# Patient Record
Sex: Male | Born: 2000 | Race: White | Hispanic: No | Marital: Single | Smoking: Never smoker
Health system: Southern US, Community
[De-identification: ages and names within clinical notes are randomized; demographics above are authoritative.]

## PROBLEM LIST (undated history)

## (undated) ENCOUNTER — Emergency Department (HOSPITAL_COMMUNITY): Payer: BC Managed Care – PPO | Source: Home / Self Care

## (undated) DIAGNOSIS — F909 Attention-deficit hyperactivity disorder, unspecified type: Secondary | ICD-10-CM

## (undated) DIAGNOSIS — H547 Unspecified visual loss: Secondary | ICD-10-CM

## (undated) DIAGNOSIS — Z8489 Family history of other specified conditions: Secondary | ICD-10-CM

## (undated) HISTORY — PX: CIRCUMCISION: SUR203

---
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
## 2023-06-06 ENCOUNTER — Ambulatory Visit
Admission: RE | Admit: 2023-06-06 | Discharge: 2023-06-06 | Disposition: A | Discharge status: Home / Self Care | Source: Ambulatory Visit | Attending: Physician Assistant | Admitting: Physician Assistant

## 2023-06-06 ENCOUNTER — Other Ambulatory Visit: Payer: Self-pay | Admitting: Physician Assistant

## 2023-06-06 DIAGNOSIS — M25611 Stiffness of right shoulder, not elsewhere classified: Secondary | ICD-10-CM

## 2023-06-27 ENCOUNTER — Other Ambulatory Visit: Payer: Self-pay

## 2023-06-27 ENCOUNTER — Encounter (HOSPITAL_COMMUNITY): Payer: Self-pay | Admitting: General Surgery

## 2023-06-27 NOTE — Progress Notes (Signed)
 SDW call  Patient's mother Loyce Ruffini was given pre-op instructions over the phone as patient works very late and is unavailable. She verbalized understanding of instructions provided.     PCP - Dr. Tobias Forth Cardiologist -  Pulmonary:    PPM/ICD - denies Device Orders - na Rep Notified - na   Chest x-ray - na EKG -  na Stress Test - ECHO -  Cardiac Cath -   Sleep Study/sleep apnea/CPAP: denies  Non-diabetic   Blood Thinner Instructions: denies Aspirin Instructions:denies   ERAS Protcol - Clears until 0430   Anesthesia review: No   Patient denies shortness of breath, fever, cough and chest pain over the phone call  Your procedure is scheduled on Friday Jun 30, 2023  Report to [facility] Main Entrance "A" at 0530   A.M., then check in with the Admitting office.  Call this number if you have problems the morning of surgery:  [phone]   If you have any questions prior to your surgery date call [phone]: Open Monday-Friday 8am-4pm If you experience any cold or flu symptoms such as cough, fever, chills, shortness of breath, etc. between now and your scheduled surgery, please notify us  at the above number     Remember:  Do not eat after midnight the night before your surgery  You may drink clear liquids until 0430  the morning of your surgery.   Clear liquids allowed are: Water, Non-Citrus Juices (without pulp), Carbonated Beverages, Clear Tea, Black Coffee ONLY (NO MILK, CREAM OR POWDERED CREAMER of any kind), and Gatorade   Take these medicines the morning of surgery with A SIP OF WATER:  Augmentin  As of today, STOP taking any Aspirin (unless otherwise instructed by your surgeon) Aleve, Naproxen, Ibuprofen , Motrin , Advil , Goody's, BC's, all herbal medications, fish oil, and all vitamins.

## 2023-06-29 ENCOUNTER — Ambulatory Visit: Payer: Self-pay | Admitting: General Surgery

## 2023-06-29 NOTE — H&P (Signed)
 HPI  Mason Baker is a 23 y.o. male was seen in clinic on 4/24 for pilonidal cyst   Patient first experienced pain in gluteal area 2-3 months ago. He did not address it at first but the area became discolored and very painful. He went to UC where it was lanced. He had an additional episodes where no drainable abscess present but received antibiotics with improvement of symptoms. Recently saw PCP and given persistent cyst even though no obvious signs of infection, referred to our general surgery clinic.   Patient denies fevers/chills. Denies drainage or erythema at this time.  10 point review of systems is negative except as listed above in HPI.  Objective  Past Medical History: Past Medical History:  Diagnosis Date   ADHD (attention deficit hyperactivity disorder)    sees Dr Adell Age   Family history of adverse reaction to anesthesia    mom gets nauseated   Vision problems     Past Surgical History: Past Surgical History:  Procedure Laterality Date   CIRCUMCISION     TENDON REPAIR Right 07/06/2013   Procedure: TENDON REPAIR,REPAIR AS INDICATED;  Surgeon: Shellie Dials, MD;  Location: [facility] OR;  Service: Orthopedics;  Laterality: Right;   WOUND EXPLORATION Right 07/06/2013   Procedure: RIGHT SMALL FINGER WITH WOUND EXPLORATION  ;  Surgeon: Shellie Dials, MD;  Location: [facility] OR;  Service: Orthopedics;  Laterality: Right;    Family History:  Family History  Problem Relation Age of Onset   Diabetes Father    Heart disease Father    Hyperlipidemia Father    Asthma Sister    Migraines Sister    Hypertension Maternal Uncle    Heart disease Maternal Uncle    Kidney disease Maternal Uncle    IgA nephropathy Maternal Uncle    ADD / ADHD Maternal Uncle    Asthma Maternal Grandmother    Hypotension Maternal Grandmother    Migraines Maternal Grandmother    Depression Maternal Grandmother    Cancer Maternal Grandfather    Diabetes Maternal Grandfather    Heart disease Maternal  Grandfather    Hyperlipidemia Maternal Grandfather    Hyperlipidemia Paternal Grandfather    Colon cancer Paternal Grandfather    Heart Problems Paternal Grandfather    Diabetes Paternal Grandfather    Cancer Other    Stroke Other    Migraines Mother    Depression Mother    ADD / ADHD Mother     Social History:  reports that he has never smoked. He has never used smokeless tobacco. He reports current alcohol use. He reports that he does not use drugs.  Allergies: No Known Allergies  Medications: I have reviewed the patient's current medications.  Labs: Pertinent lab work personally reviewed.  Imaging: Pertinent imaging personally reviewed  Physical Exam There were no vitals taken for this visit. General: No acute distress, well appearing HEENT: PERRL, hearing grossly normal, mucous membranes moist CV: Regular rate and rhythm Pulm: Normal work of breathing on room air GU: 2x1cm cyst at left superior gluteal fold. No erythema/drainage/fluctuance. Nontender. Several pits identified in midline gluteal cleft consistent with pilonidal sinuses. Extremities: Warm and well perfused Neuro: A&O x4, no focal neurologic deficits Psych: Appropriate mood and effect    Assessment   Mason Baker is an 23 y.o. male with pilonidal cyst  Plan  - Proceed to OR for excision of pilonidal cyst - We discussed risks of surgery including but not limited to: bleeding, infection, recurrence. Possibility  of primary closure versus open wound with healing by secondary intention if any signs of infection intra-op. We also discussed this area is higher risk for wound dehiscence if primarily closed.    Freddrick Jaffe, MD [facility]

## 2023-06-29 NOTE — H&P (View-Only) (Signed)
 HPI  Mason Baker is a 23 y.o. male was seen in clinic on 4/24 for pilonidal cyst   Patient first experienced pain in gluteal area 2-3 months ago. He did not address it at first but the area became discolored and very painful. He went to UC where it was lanced. He had an additional episodes where no drainable abscess present but received antibiotics with improvement of symptoms. Recently saw PCP and given persistent cyst even though no obvious signs of infection, referred to our general surgery clinic.   Patient denies fevers/chills. Denies drainage or erythema at this time.  10 point review of systems is negative except as listed above in HPI.  Objective  Past Medical History: Past Medical History:  Diagnosis Date   ADHD (attention deficit hyperactivity disorder)    sees Dr Adell Age   Family history of adverse reaction to anesthesia    mom gets nauseated   Vision problems     Past Surgical History: Past Surgical History:  Procedure Laterality Date   CIRCUMCISION     TENDON REPAIR Right 07/06/2013   Procedure: TENDON REPAIR,REPAIR AS INDICATED;  Surgeon: Shellie Dials, MD;  Location: [facility] OR;  Service: Orthopedics;  Laterality: Right;   WOUND EXPLORATION Right 07/06/2013   Procedure: RIGHT SMALL FINGER WITH WOUND EXPLORATION  ;  Surgeon: Shellie Dials, MD;  Location: [facility] OR;  Service: Orthopedics;  Laterality: Right;    Family History:  Family History  Problem Relation Age of Onset   Diabetes Father    Heart disease Father    Hyperlipidemia Father    Asthma Sister    Migraines Sister    Hypertension Maternal Uncle    Heart disease Maternal Uncle    Kidney disease Maternal Uncle    IgA nephropathy Maternal Uncle    ADD / ADHD Maternal Uncle    Asthma Maternal Grandmother    Hypotension Maternal Grandmother    Migraines Maternal Grandmother    Depression Maternal Grandmother    Cancer Maternal Grandfather    Diabetes Maternal Grandfather    Heart disease Maternal  Grandfather    Hyperlipidemia Maternal Grandfather    Hyperlipidemia Paternal Grandfather    Colon cancer Paternal Grandfather    Heart Problems Paternal Grandfather    Diabetes Paternal Grandfather    Cancer Other    Stroke Other    Migraines Mother    Depression Mother    ADD / ADHD Mother     Social History:  reports that he has never smoked. He has never used smokeless tobacco. He reports current alcohol use. He reports that he does not use drugs.  Allergies: No Known Allergies  Medications: I have reviewed the patient's current medications.  Labs: Pertinent lab work personally reviewed.  Imaging: Pertinent imaging personally reviewed  Physical Exam There were no vitals taken for this visit. General: No acute distress, well appearing HEENT: PERRL, hearing grossly normal, mucous membranes moist CV: Regular rate and rhythm Pulm: Normal work of breathing on room air GU: 2x1cm cyst at left superior gluteal fold. No erythema/drainage/fluctuance. Nontender. Several pits identified in midline gluteal cleft consistent with pilonidal sinuses. Extremities: Warm and well perfused Neuro: A&O x4, no focal neurologic deficits Psych: Appropriate mood and effect    Assessment   Mason Baker is an 23 y.o. male with pilonidal cyst  Plan  - Proceed to OR for excision of pilonidal cyst - We discussed risks of surgery including but not limited to: bleeding, infection, recurrence. Possibility  of primary closure versus open wound with healing by secondary intention if any signs of infection intra-op. We also discussed this area is higher risk for wound dehiscence if primarily closed.    Freddrick Jaffe, MD [facility]

## 2023-06-30 ENCOUNTER — Encounter (HOSPITAL_COMMUNITY): Payer: Self-pay | Admitting: General Surgery

## 2023-06-30 ENCOUNTER — Encounter (HOSPITAL_COMMUNITY)
Admission: RE | Disposition: A | Discharge status: Home / Self Care | Payer: Self-pay | Source: Home / Self Care | Attending: General Surgery

## 2023-06-30 ENCOUNTER — Ambulatory Visit (HOSPITAL_COMMUNITY)
Admission: RE | Admit: 2023-06-30 | Discharge: 2023-06-30 | Disposition: A | Discharge status: Home / Self Care | Attending: General Surgery | Admitting: General Surgery

## 2023-06-30 ENCOUNTER — Other Ambulatory Visit: Payer: Self-pay

## 2023-06-30 ENCOUNTER — Ambulatory Visit (HOSPITAL_COMMUNITY): Payer: Self-pay

## 2023-06-30 ENCOUNTER — Ambulatory Visit (HOSPITAL_BASED_OUTPATIENT_CLINIC_OR_DEPARTMENT_OTHER): Payer: Self-pay

## 2023-06-30 DIAGNOSIS — F419 Anxiety disorder, unspecified: Secondary | ICD-10-CM | POA: Insufficient documentation

## 2023-06-30 DIAGNOSIS — L0591 Pilonidal cyst without abscess: Secondary | ICD-10-CM | POA: Diagnosis present

## 2023-06-30 DIAGNOSIS — R519 Headache, unspecified: Secondary | ICD-10-CM | POA: Diagnosis not present

## 2023-06-30 DIAGNOSIS — E669 Obesity, unspecified: Secondary | ICD-10-CM | POA: Insufficient documentation

## 2023-06-30 DIAGNOSIS — Z833 Family history of diabetes mellitus: Secondary | ICD-10-CM | POA: Diagnosis not present

## 2023-06-30 DIAGNOSIS — F909 Attention-deficit hyperactivity disorder, unspecified type: Secondary | ICD-10-CM | POA: Diagnosis not present

## 2023-06-30 DIAGNOSIS — Z6834 Body mass index (BMI) 34.0-34.9, adult: Secondary | ICD-10-CM | POA: Insufficient documentation

## 2023-06-30 HISTORY — DX: Family history of other specified conditions: Z84.89

## 2023-06-30 HISTORY — PX: PILONIDAL CYST DRAINAGE: SHX743

## 2023-06-30 LAB — CBC
HCT: 46.2 % (ref 39.0–52.0)
Hemoglobin: 16.1 g/dL (ref 13.0–17.0)
MCH: 30.4 pg (ref 26.0–34.0)
MCHC: 34.8 g/dL (ref 30.0–36.0)
MCV: 87.3 fL (ref 80.0–100.0)
Platelets: 314 10*3/uL (ref 150–400)
RBC: 5.29 MIL/uL (ref 4.22–5.81)
RDW: 11.5 % (ref 11.5–15.5)
WBC: 5.9 10*3/uL (ref 4.0–10.5)
nRBC: 0 % (ref 0.0–0.2)

## 2023-06-30 SURGERY — EXCISION, PILONIDAL CYST
Anesthesia: General | Site: Buttocks

## 2023-06-30 MED ORDER — ONDANSETRON HCL 4 MG/2ML IJ SOLN
INTRAMUSCULAR | Status: AC
  Filled 2023-06-30: qty 2

## 2023-06-30 MED ORDER — CEFAZOLIN SODIUM-DEXTROSE 2-4 GM/100ML-% IV SOLN
INTRAVENOUS | Status: AC
Start: 2023-06-30 — End: 2023-06-30
  Filled 2023-06-30: qty 100

## 2023-06-30 MED ORDER — GABAPENTIN 300 MG PO CAPS
ORAL_CAPSULE | ORAL | Status: AC
Start: 2023-06-30 — End: 2023-06-30
  Administered 2023-06-30: 300 mg via ORAL
  Filled 2023-06-30: qty 1

## 2023-06-30 MED ORDER — PROPOFOL 1000 MG/100ML IV EMUL
INTRAVENOUS | Status: AC
  Filled 2023-06-30: qty 200

## 2023-06-30 MED ORDER — ACETAMINOPHEN 500 MG PO TABS: 1000.0000 mg | ORAL_TABLET | ORAL | Status: AC

## 2023-06-30 MED ORDER — FENTANYL CITRATE (PF) 250 MCG/5ML IJ SOLN
INTRAMUSCULAR | Status: AC
  Filled 2023-06-30: qty 5

## 2023-06-30 MED ORDER — FENTANYL CITRATE (PF) 250 MCG/5ML IJ SOLN
INTRAMUSCULAR | Status: DC | PRN
  Administered 2023-06-30: 50 ug via INTRAVENOUS

## 2023-06-30 MED ORDER — ONDANSETRON HCL 4 MG/2ML IJ SOLN: 4.0000 mg | Freq: Once | INTRAMUSCULAR | Status: DC | PRN

## 2023-06-30 MED ORDER — PROPOFOL 10 MG/ML IV BOLUS
INTRAVENOUS | Status: AC
  Filled 2023-06-30: qty 20

## 2023-06-30 MED ORDER — CHLORHEXIDINE GLUCONATE 0.12 % MT SOLN: 15.0000 mL | Freq: Once | OROMUCOSAL | Status: AC

## 2023-06-30 MED ORDER — PHENYLEPHRINE HCL-NACL 20-0.9 MG/250ML-% IV SOLN
INTRAVENOUS | Status: AC
  Filled 2023-06-30: qty 250

## 2023-06-30 MED ORDER — CHLORHEXIDINE GLUCONATE 0.12 % MT SOLN
OROMUCOSAL | Status: AC
Start: 2023-06-30 — End: 2023-06-30
  Administered 2023-06-30: 15 mL via OROMUCOSAL
  Filled 2023-06-30: qty 15

## 2023-06-30 MED ORDER — BUPIVACAINE-EPINEPHRINE (PF) 0.25% -1:200000 IJ SOLN
INTRAMUSCULAR | Status: AC
  Filled 2023-06-30: qty 30

## 2023-06-30 MED ORDER — BUPIVACAINE-EPINEPHRINE 0.25% -1:200000 IJ SOLN
INTRAMUSCULAR | Status: DC | PRN
  Administered 2023-06-30: 30 mL

## 2023-06-30 MED ORDER — ROCURONIUM BROMIDE 10 MG/ML (PF) SYRINGE
PREFILLED_SYRINGE | INTRAVENOUS | Status: DC | PRN
Start: 2023-06-30 — End: 2023-06-30
  Administered 2023-06-30: 50 mg via INTRAVENOUS

## 2023-06-30 MED ORDER — CELECOXIB 200 MG PO CAPS: 200.0000 mg | ORAL_CAPSULE | ORAL | Status: AC

## 2023-06-30 MED ORDER — DEXAMETHASONE SODIUM PHOSPHATE 10 MG/ML IJ SOLN
INTRAMUSCULAR | Status: DC | PRN
  Administered 2023-06-30: 5 mg via INTRAVENOUS

## 2023-06-30 MED ORDER — MIDAZOLAM HCL 2 MG/2ML IJ SOLN
INTRAMUSCULAR | Status: AC
  Filled 2023-06-30: qty 2

## 2023-06-30 MED ORDER — ONDANSETRON HCL 4 MG/2ML IJ SOLN
INTRAMUSCULAR | Status: DC | PRN
  Administered 2023-06-30: 4 mg via INTRAVENOUS

## 2023-06-30 MED ORDER — PROPOFOL 10 MG/ML IV BOLUS
INTRAVENOUS | Status: DC | PRN
  Administered 2023-06-30: 200 mg via INTRAVENOUS

## 2023-06-30 MED ORDER — PHENYLEPHRINE 80 MCG/ML (10ML) SYRINGE FOR IV PUSH (FOR BLOOD PRESSURE SUPPORT)
PREFILLED_SYRINGE | INTRAVENOUS | Status: DC | PRN
  Administered 2023-06-30 (×3): 80 ug via INTRAVENOUS

## 2023-06-30 MED ORDER — ROCURONIUM BROMIDE 10 MG/ML (PF) SYRINGE
PREFILLED_SYRINGE | INTRAVENOUS | Status: AC
  Filled 2023-06-30: qty 10

## 2023-06-30 MED ORDER — ACETAMINOPHEN 500 MG PO TABS
ORAL_TABLET | ORAL | Status: AC
  Administered 2023-06-30: 1000 mg via ORAL
  Filled 2023-06-30: qty 2

## 2023-06-30 MED ORDER — MIDAZOLAM HCL 2 MG/2ML IJ SOLN
INTRAMUSCULAR | Status: DC | PRN
  Administered 2023-06-30: 2 mg via INTRAVENOUS

## 2023-06-30 MED ORDER — LIDOCAINE 2% (20 MG/ML) 5 ML SYRINGE
INTRAMUSCULAR | Status: AC
  Filled 2023-06-30: qty 5

## 2023-06-30 MED ORDER — LACTATED RINGERS IV SOLN: INTRAVENOUS | Status: DC

## 2023-06-30 MED ORDER — GABAPENTIN 300 MG PO CAPS
300.0000 mg | ORAL_CAPSULE | ORAL | Status: AC
Start: 2023-06-30 — End: 2023-06-30

## 2023-06-30 MED ORDER — OXYCODONE HCL 5 MG PO TABS: 5.0000 mg | ORAL_TABLET | ORAL | 0 refills | Status: AC | PRN

## 2023-06-30 MED ORDER — LIDOCAINE 2% (20 MG/ML) 5 ML SYRINGE
INTRAMUSCULAR | Status: DC | PRN
  Administered 2023-06-30: 100 mg via INTRAVENOUS

## 2023-06-30 MED ORDER — ORAL CARE MOUTH RINSE: 15.0000 mL | Freq: Once | OROMUCOSAL | Status: AC

## 2023-06-30 MED ORDER — 0.9 % SODIUM CHLORIDE (POUR BTL) OPTIME
TOPICAL | Status: DC | PRN
  Administered 2023-06-30: 1000 mL

## 2023-06-30 MED ORDER — ENOXAPARIN SODIUM 40 MG/0.4ML IJ SOSY
PREFILLED_SYRINGE | INTRAMUSCULAR | Status: AC
Start: 2023-06-30 — End: 2023-06-30
  Administered 2023-06-30: 40 mg via SUBCUTANEOUS
  Filled 2023-06-30: qty 0.4

## 2023-06-30 MED ORDER — SUGAMMADEX SODIUM 200 MG/2ML IV SOLN
INTRAVENOUS | Status: DC | PRN
  Administered 2023-06-30: 200 mg via INTRAVENOUS

## 2023-06-30 MED ORDER — AMISULPRIDE (ANTIEMETIC) 5 MG/2ML IV SOLN: 10.0000 mg | Freq: Once | INTRAVENOUS | Status: DC | PRN

## 2023-06-30 MED ORDER — DEXAMETHASONE SODIUM PHOSPHATE 10 MG/ML IJ SOLN
INTRAMUSCULAR | Status: AC
  Filled 2023-06-30: qty 1

## 2023-06-30 MED ORDER — FENTANYL CITRATE (PF) 100 MCG/2ML IJ SOLN: 25.0000 ug | INTRAMUSCULAR | Status: DC | PRN

## 2023-06-30 MED ORDER — CEFAZOLIN SODIUM-DEXTROSE 2-4 GM/100ML-% IV SOLN
2.0000 g | INTRAVENOUS | Status: AC
  Administered 2023-06-30: 2 g via INTRAVENOUS

## 2023-06-30 MED ORDER — ENOXAPARIN SODIUM 40 MG/0.4ML IJ SOSY: 40.0000 mg | PREFILLED_SYRINGE | Freq: Once | INTRAMUSCULAR | Status: AC

## 2023-06-30 MED ORDER — KETAMINE HCL 50 MG/5ML IJ SOSY
PREFILLED_SYRINGE | INTRAMUSCULAR | Status: AC
  Filled 2023-06-30: qty 5

## 2023-06-30 MED ORDER — CELECOXIB 200 MG PO CAPS
ORAL_CAPSULE | ORAL | Status: AC
  Administered 2023-06-30: 200 mg via ORAL
  Filled 2023-06-30: qty 1

## 2023-06-30 SURGICAL SUPPLY — 26 items
BLADE CLIPPER SURG (BLADE) IMPLANT
CANISTER SUCTION 3000ML PPV (SUCTIONS) IMPLANT
COVER SURGICAL LIGHT HANDLE (MISCELLANEOUS) ×2 IMPLANT
DRAPE LAPAROTOMY T 102X78X121 (DRAPES) ×2 IMPLANT
ELECTRODE REM PT RTRN 9FT ADLT (ELECTROSURGICAL) ×2 IMPLANT
GAUZE PACKING IODOFORM 1/4X15 (PACKING) IMPLANT
GAUZE PAD ABD 8X10 STRL (GAUZE/BANDAGES/DRESSINGS) IMPLANT
GAUZE SPONGE 4X4 12PLY STRL (GAUZE/BANDAGES/DRESSINGS) ×2 IMPLANT
GLOVE SURG SIGNA 7.5 PF LTX (GLOVE) ×4 IMPLANT
GOWN STRL REUS W/ TWL LRG LVL3 (GOWN DISPOSABLE) ×2 IMPLANT
GOWN STRL REUS W/ TWL XL LVL3 (GOWN DISPOSABLE) ×2 IMPLANT
KIT BASIN OR (CUSTOM PROCEDURE TRAY) ×2 IMPLANT
KIT TURNOVER KIT B (KITS) ×2 IMPLANT
NDL HYPO 25GX1X1/2 BEV (NEEDLE) IMPLANT
NEEDLE HYPO 25GX1X1/2 BEV (NEEDLE) IMPLANT
NS IRRIG 1000ML POUR BTL (IV SOLUTION) ×2 IMPLANT
PACK GENERAL/GYN (CUSTOM PROCEDURE TRAY) ×2 IMPLANT
PAD ARMBOARD POSITIONER FOAM (MISCELLANEOUS) ×4 IMPLANT
PENCIL SMOKE EVACUATOR (MISCELLANEOUS) ×2 IMPLANT
PUNCH BIOPSY DISP 4 (MISCELLANEOUS) IMPLANT
SPECIMEN JAR SMALL (MISCELLANEOUS) IMPLANT
SUT ETHILON 2 0 FS 18 (SUTURE) IMPLANT
SUT VIC AB 3-0 SH 18 (SUTURE) IMPLANT
TOWEL GREEN STERILE (TOWEL DISPOSABLE) ×2 IMPLANT
TOWEL GREEN STERILE FF (TOWEL DISPOSABLE) ×2 IMPLANT
UNDERPAD 30X36 HEAVY ABSORB (UNDERPADS AND DIAPERS) ×2 IMPLANT

## 2023-06-30 NOTE — Anesthesia Preprocedure Evaluation (Addendum)
 Anesthesia Evaluation  Patient identified by MRN, date of birth, ID band Patient awake    Reviewed: Allergy & Precautions, NPO status , Patient's Chart, lab work & pertinent test results  History of Anesthesia Complications (+) Family history of anesthesia reaction  Airway Mallampati: II  TM Distance: >3 FB Neck ROM: Full    Dental  (+) Teeth Intact, Dental Advisory Given, Chipped,    Pulmonary neg pulmonary ROS   Pulmonary exam normal breath sounds clear to auscultation       Cardiovascular Exercise Tolerance: Good negative cardio ROS Normal cardiovascular exam Rhythm:Regular Rate:Normal     Neuro/Psych  Headaches PSYCHIATRIC DISORDERS Anxiety        GI/Hepatic negative GI ROS, Neg liver ROS,,,  Endo/Other  Obesity   Renal/GU negative Renal ROS     Musculoskeletal negative musculoskeletal ROS (+)    Abdominal   Peds  (+) ADHD Hematology negative hematology ROS (+)   Anesthesia Other Findings Day of surgery medications reviewed with the patient.  Pilonidal cyst   Reproductive/Obstetrics                             Anesthesia Physical Anesthesia Plan  ASA: 2  Anesthesia Plan: General   Post-op Pain Management: Tylenol  PO (pre-op)*, Celebrex PO (pre-op)* and Gabapentin PO (pre-op)*   Induction: Intravenous  PONV Risk Score and Plan: 2 and Midazolam , Dexamethasone and Ondansetron   Airway Management Planned: Oral ETT  Additional Equipment:   Intra-op Plan:   Post-operative Plan: Extubation in OR  Informed Consent: I have reviewed the patients History and Physical, chart, labs and discussed the procedure including the risks, benefits and alternatives for the proposed anesthesia with the patient or authorized representative who has indicated his/her understanding and acceptance.     Dental advisory given  Plan Discussed with: CRNA  Anesthesia Plan Comments:         Anesthesia Quick Evaluation

## 2023-06-30 NOTE — Transfer of Care (Signed)
 Immediate Anesthesia Transfer of Care Note  Patient: Mason Baker  Procedure(s) Performed: EXCISION, PILONIDAL CYST (Buttocks)  Patient Location: PACU  Anesthesia Type:General  Level of Consciousness: awake, alert , and patient cooperative  Airway & Oxygen Therapy: Patient Spontanous Breathing and Patient connected to face mask oxygen  Post-op Assessment: Report given to RN, Post -op Vital signs reviewed and stable, and Patient moving all extremities X 4  Post vital signs: Reviewed and stable  Last Vitals:  Vitals Value Taken Time  BP 123/73 06/30/23 0848  Temp    Pulse 100 06/30/23 0850  Resp 18 06/30/23 0850  SpO2 100 % 06/30/23 0850  Vitals shown include unfiled device data.  Last Pain:  Vitals:   06/30/23 0600  TempSrc:   PainSc: 0-No pain         Complications: There were no known notable events for this encounter.

## 2023-06-30 NOTE — Op Note (Signed)
 EXCISION, PILONIDAL CYST  Operative Note (CSN: [id number])  Service  Date of Surgery: 06/30/2023 Admit Date: 06/30/2023 Performing Service: General Surgeons and Role:    Ramiro Burly, Lisa Rideau, MD - Primary  Op Note Pre-op Diagnosis: PILONIDAL CYST Post-op Diagnosis: Post-Op Diagnosis Codes:    * Pilonidal cyst [L05.91]  Procedure(s): EXCISION, PILONIDAL CYST  Findings: 3x2cm chronically inflamed pilonidal cyst. Inferior pilonidal sinus tract. Both sent for pathology  Anesthesia: General Estimated Blood Loss: 5cc  Complications: None Specimens: Pilonidal sinus, Pilonidal cyst.  Brief history / Indications for Surgery: 23 yo M with history of pilonidal cyst with gluteal abscess now with persistent cyst and granulation tissue.  Procedure Details  Prior to the procedure, the risks, benefits, complications, treatment options, and expected outcomes were discussed with the patient and/or family, including but not limited to, the risks of bleeding, infection, post-op seroma, post-op hematoma, wound dehiscence, and recurrence.[name]  Despite the risks, the patient has given informed consent for operative intervention.  The patient was taken to the Operating Room, identified as Mason Baker and the procedure verified as excision of pilonidal cyst.  Identification pause was held and the above information confirmed.  The patient was placed in the supine position and General LMA anesthesia was induced. He was then positioned prone. The gluteal region was prepped with betadine and draped in the typical sterile fashion.  A formal preincision time out was performed.  Using a punch biopsy, two intergluteal cleft pits were excised and sent for pathology. This was probed with a lacrimal duct probe and no obvious connection to cyst in gluteal cleft.  An elliptical incision was then made overlying the palpable pilonidal cyst in the left gluteal cleft. Electrocautery was use for dissection of pilonidal cyst back  to healthy subcutaneous tissue. This was removed and sent for pathology. A lacrimal duct probe was then use again and a small communication was noted to inferior pits. The tract was curetted back to healthy bleeding tissue. The currette used again to probe at opening of excised pits.   Both wound beds irrigated and hemostasis confirmed. The wound opening inferiorly where pits excised was packed with 1/4 inch iodoform. Wound bed of pilonidal cyst approximated with 3-0 vicryls and skin closed with interrupted vertical mattress sutures using 2-0 nylon.  Instrument, sponge, and needle counts were correct at the conclusion of the case.   Freddrick Jaffe, MD [facility] Surgery Date: 06/30/2023  Time: 7:38 AM

## 2023-06-30 NOTE — Interval H&P Note (Signed)
 History and Physical Interval Note:  06/30/2023 7:11 AM  Mason Baker  has presented today for surgery, with the diagnosis of PILONIDAL CYST.  The various methods of treatment have been discussed with the patient and family. After consideration of risks, benefits and other options for treatment, the patient has consented to  Procedure(s) with comments: EXCISION, PILONIDAL CYST (N/A) - LATERALITY: NA PRONE EXCISION OF PILONAL CYST AND SINUS as a surgical intervention.  The patient's history has been reviewed, patient examined, no change in status, stable for surgery.  I have reviewed the patient's chart and labs.  Questions were answered to the patient's satisfaction.     Edmon Gosling

## 2023-06-30 NOTE — Anesthesia Postprocedure Evaluation (Signed)
 Anesthesia Post Note  Patient: Mason Baker  Procedure(s) Performed: EXCISION, PILONIDAL CYST (Buttocks)     Patient location during evaluation: PACU Anesthesia Type: General Level of consciousness: awake and alert Pain management: pain level controlled Vital Signs Assessment: post-procedure vital signs reviewed and stable Respiratory status: spontaneous breathing, nonlabored ventilation and respiratory function stable Cardiovascular status: blood pressure returned to baseline and stable Postop Assessment: no apparent nausea or vomiting Anesthetic complications: no   There were no known notable events for this encounter.  Last Vitals:  Vitals:   06/30/23 0915 06/30/23 0920  BP: (!) 144/93   Pulse: 77 78  Resp: 16 16  Temp:  36.8 C  SpO2: 99% 96%    Last Pain:  Vitals:   06/30/23 0920  TempSrc:   PainSc: 0-No pain                 Erin Havers

## 2023-06-30 NOTE — Anesthesia Procedure Notes (Addendum)
 Procedure Name: Intubation Date/Time: 06/30/2023 7:43 AM  Performed by: Zola Hint, CRNAPre-anesthesia Checklist: Patient identified, Emergency Drugs available, Suction available and Patient being monitored Patient Re-evaluated:Patient Re-evaluated prior to induction Oxygen Delivery Method: Circle System Utilized Preoxygenation: Pre-oxygenation with 100% oxygen Induction Type: IV induction Ventilation: Mask ventilation without difficulty Laryngoscope Size: Mac and 4 Grade View: Grade II Tube type: Oral Number of attempts: 1 Airway Equipment and Method: Stylet and Oral airway Placement Confirmation: ETT inserted through vocal cords under direct vision, positive ETCO2 and breath sounds checked- equal and bilateral Secured at: 22 cm Tube secured with: Tape Dental Injury: Teeth and Oropharynx as per pre-operative assessment

## 2023-07-01 ENCOUNTER — Encounter (HOSPITAL_COMMUNITY): Payer: Self-pay | Admitting: General Surgery

## 2023-07-03 LAB — SURGICAL PATHOLOGY
